# Patient Record
Sex: Female | Born: 1965
Health system: Southern US, Community
[De-identification: ages and names within clinical notes are randomized; demographics above are authoritative.]

## PROBLEM LIST (undated history)

## (undated) DIAGNOSIS — J449 Chronic obstructive pulmonary disease, unspecified: Secondary | ICD-10-CM

## (undated) DIAGNOSIS — I10 Essential (primary) hypertension: Secondary | ICD-10-CM

## (undated) DIAGNOSIS — E669 Obesity, unspecified: Secondary | ICD-10-CM

## (undated) DIAGNOSIS — M797 Fibromyalgia: Secondary | ICD-10-CM

## (undated) DIAGNOSIS — J45909 Unspecified asthma, uncomplicated: Secondary | ICD-10-CM

## (undated) HISTORY — PX: ABDOMINAL HYSTERECTOMY: SHX81

---

## 1998-02-07 ENCOUNTER — Emergency Department (HOSPITAL_COMMUNITY): Admission: EM | Admit: 1998-02-07 | Discharge: 1998-02-07 | Payer: Self-pay | Admitting: *Deleted

## 1998-02-12 ENCOUNTER — Other Ambulatory Visit: Admission: RE | Admit: 1998-02-12 | Discharge: 1998-02-12 | Payer: Self-pay | Admitting: Obstetrics

## 1998-02-12 ENCOUNTER — Ambulatory Visit (HOSPITAL_COMMUNITY): Admission: RE | Admit: 1998-02-12 | Discharge: 1998-02-12 | Payer: Self-pay | Admitting: Obstetrics

## 1998-02-26 ENCOUNTER — Ambulatory Visit (HOSPITAL_COMMUNITY): Admission: RE | Admit: 1998-02-26 | Discharge: 1998-02-26 | Payer: Self-pay | Admitting: Obstetrics

## 1998-02-26 ENCOUNTER — Encounter: Payer: Self-pay | Admitting: Obstetrics

## 2009-09-09 ENCOUNTER — Emergency Department (HOSPITAL_BASED_OUTPATIENT_CLINIC_OR_DEPARTMENT_OTHER): Admission: EM | Admit: 2009-09-09 | Discharge: 2009-09-09 | Payer: Self-pay | Admitting: Emergency Medicine

## 2009-10-31 ENCOUNTER — Emergency Department (HOSPITAL_BASED_OUTPATIENT_CLINIC_OR_DEPARTMENT_OTHER): Admission: EM | Admit: 2009-10-31 | Discharge: 2009-10-31 | Payer: Self-pay | Admitting: Emergency Medicine

## 2009-11-04 ENCOUNTER — Emergency Department (HOSPITAL_COMMUNITY): Admission: EM | Admit: 2009-11-04 | Discharge: 2009-11-04 | Payer: Self-pay | Admitting: Emergency Medicine

## 2009-11-25 ENCOUNTER — Emergency Department (HOSPITAL_COMMUNITY): Admission: EM | Admit: 2009-11-25 | Discharge: 2009-11-25 | Payer: Self-pay | Admitting: Family Medicine

## 2010-04-20 ENCOUNTER — Encounter: Payer: Self-pay | Admitting: Obstetrics and Gynecology

## 2010-06-13 LAB — POCT URINALYSIS DIPSTICK
Bilirubin Urine: NEGATIVE
Glucose, UA: NEGATIVE mg/dL
Ketones, ur: NEGATIVE mg/dL
Nitrite: NEGATIVE
Protein, ur: NEGATIVE mg/dL
Specific Gravity, Urine: 1.02 (ref 1.005–1.030)
Urobilinogen, UA: 1 mg/dL (ref 0.0–1.0)
pH: 6.5 (ref 5.0–8.0)

## 2010-06-16 LAB — URINE MICROSCOPIC-ADD ON

## 2010-06-16 LAB — URINALYSIS, ROUTINE W REFLEX MICROSCOPIC
Bilirubin Urine: NEGATIVE
Glucose, UA: NEGATIVE mg/dL
Hgb urine dipstick: NEGATIVE
Ketones, ur: NEGATIVE mg/dL
Nitrite: NEGATIVE
Protein, ur: NEGATIVE mg/dL
Specific Gravity, Urine: 1.025 (ref 1.005–1.030)
Urobilinogen, UA: 1 mg/dL (ref 0.0–1.0)
pH: 6 (ref 5.0–8.0)

## 2010-06-16 LAB — GC/CHLAMYDIA PROBE AMP, GENITAL: GC Probe Amp, Genital: NEGATIVE

## 2010-06-16 LAB — WET PREP, GENITAL: Yeast Wet Prep HPF POC: NONE SEEN

## 2012-06-04 ENCOUNTER — Emergency Department (HOSPITAL_BASED_OUTPATIENT_CLINIC_OR_DEPARTMENT_OTHER): Payer: 59

## 2012-06-04 ENCOUNTER — Emergency Department (HOSPITAL_BASED_OUTPATIENT_CLINIC_OR_DEPARTMENT_OTHER)
Admission: EM | Admit: 2012-06-04 | Discharge: 2012-06-04 | Disposition: A | Discharge status: Home / Self Care | Payer: 59 | Attending: Emergency Medicine | Admitting: Emergency Medicine

## 2012-06-04 ENCOUNTER — Encounter (HOSPITAL_BASED_OUTPATIENT_CLINIC_OR_DEPARTMENT_OTHER): Payer: Self-pay

## 2012-06-04 DIAGNOSIS — Z79899 Other long term (current) drug therapy: Secondary | ICD-10-CM | POA: Insufficient documentation

## 2012-06-04 DIAGNOSIS — I10 Essential (primary) hypertension: Secondary | ICD-10-CM | POA: Insufficient documentation

## 2012-06-04 DIAGNOSIS — J45909 Unspecified asthma, uncomplicated: Secondary | ICD-10-CM | POA: Insufficient documentation

## 2012-06-04 DIAGNOSIS — Y9389 Activity, other specified: Secondary | ICD-10-CM | POA: Insufficient documentation

## 2012-06-04 DIAGNOSIS — S139XXA Sprain of joints and ligaments of unspecified parts of neck, initial encounter: Secondary | ICD-10-CM | POA: Insufficient documentation

## 2012-06-04 DIAGNOSIS — Z87891 Personal history of nicotine dependence: Secondary | ICD-10-CM | POA: Insufficient documentation

## 2012-06-04 DIAGNOSIS — E669 Obesity, unspecified: Secondary | ICD-10-CM | POA: Insufficient documentation

## 2012-06-04 DIAGNOSIS — Y92410 Unspecified street and highway as the place of occurrence of the external cause: Secondary | ICD-10-CM | POA: Insufficient documentation

## 2012-06-04 HISTORY — DX: Unspecified asthma, uncomplicated: J45.909

## 2012-06-04 HISTORY — DX: Essential (primary) hypertension: I10

## 2012-06-04 HISTORY — DX: Obesity, unspecified: E66.9

## 2012-06-04 MED ORDER — DIAZEPAM 5 MG PO TABS
ORAL_TABLET | ORAL | Status: AC
  Administered 2012-06-04: 5 mg via ORAL
  Filled 2012-06-04: qty 1

## 2012-06-04 MED ORDER — HYDROCODONE-ACETAMINOPHEN 5-325 MG PO TABS
1.0000 | ORAL_TABLET | Freq: Once | ORAL | Status: AC
  Administered 2012-06-04: 1 via ORAL

## 2012-06-04 MED ORDER — HYDROCODONE-ACETAMINOPHEN 5-325 MG PO TABS
ORAL_TABLET | ORAL | Status: AC
  Administered 2012-06-04: 1 via ORAL
  Filled 2012-06-04: qty 1

## 2012-06-04 MED ORDER — DIAZEPAM 5 MG PO TABS
5.0000 mg | ORAL_TABLET | ORAL | Status: AC | PRN
Start: 2012-06-04 — End: ?

## 2012-06-04 MED ORDER — DIAZEPAM 5 MG PO TABS
5.0000 mg | ORAL_TABLET | Freq: Once | ORAL | Status: AC
  Administered 2012-06-04: 5 mg via ORAL

## 2012-06-04 MED ORDER — OXYCODONE-ACETAMINOPHEN 5-325 MG PO TABS: 2.0000 | ORAL_TABLET | ORAL | Status: AC | PRN
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
## 2014-01-04 ENCOUNTER — Emergency Department (HOSPITAL_BASED_OUTPATIENT_CLINIC_OR_DEPARTMENT_OTHER)
Admission: EM | Admit: 2014-01-04 | Discharge: 2014-01-04 | Disposition: A | Discharge status: Home / Self Care | Payer: Medicare Other | Attending: Emergency Medicine | Admitting: Emergency Medicine

## 2014-01-04 ENCOUNTER — Emergency Department (HOSPITAL_BASED_OUTPATIENT_CLINIC_OR_DEPARTMENT_OTHER): Payer: Medicare Other

## 2014-01-04 ENCOUNTER — Encounter (HOSPITAL_BASED_OUTPATIENT_CLINIC_OR_DEPARTMENT_OTHER): Payer: Self-pay | Admitting: Emergency Medicine

## 2014-01-04 DIAGNOSIS — Z79899 Other long term (current) drug therapy: Secondary | ICD-10-CM | POA: Insufficient documentation

## 2014-01-04 DIAGNOSIS — Z7951 Long term (current) use of inhaled steroids: Secondary | ICD-10-CM | POA: Diagnosis not present

## 2014-01-04 DIAGNOSIS — S59911A Unspecified injury of right forearm, initial encounter: Secondary | ICD-10-CM | POA: Diagnosis not present

## 2014-01-04 DIAGNOSIS — J45909 Unspecified asthma, uncomplicated: Secondary | ICD-10-CM | POA: Insufficient documentation

## 2014-01-04 DIAGNOSIS — S6991XA Unspecified injury of right wrist, hand and finger(s), initial encounter: Secondary | ICD-10-CM | POA: Insufficient documentation

## 2014-01-04 DIAGNOSIS — I10 Essential (primary) hypertension: Secondary | ICD-10-CM | POA: Insufficient documentation

## 2014-01-04 DIAGNOSIS — Y9389 Activity, other specified: Secondary | ICD-10-CM | POA: Diagnosis not present

## 2014-01-04 DIAGNOSIS — Z87891 Personal history of nicotine dependence: Secondary | ICD-10-CM | POA: Insufficient documentation

## 2014-01-04 DIAGNOSIS — Y92410 Unspecified street and highway as the place of occurrence of the external cause: Secondary | ICD-10-CM | POA: Insufficient documentation

## 2014-01-04 DIAGNOSIS — E669 Obesity, unspecified: Secondary | ICD-10-CM | POA: Insufficient documentation

## 2014-01-04 DIAGNOSIS — M25531 Pain in right wrist: Secondary | ICD-10-CM

## 2014-01-04 DIAGNOSIS — S46819A Strain of other muscles, fascia and tendons at shoulder and upper arm level, unspecified arm, initial encounter: Secondary | ICD-10-CM | POA: Diagnosis not present

## 2014-01-04 DIAGNOSIS — S4990XA Unspecified injury of shoulder and upper arm, unspecified arm, initial encounter: Secondary | ICD-10-CM | POA: Diagnosis present

## 2014-01-04 HISTORY — DX: Fibromyalgia: M79.7

## 2014-01-04 MED ORDER — ALBUTEROL SULFATE HFA 108 (90 BASE) MCG/ACT IN AERS
2.0000 | INHALATION_SPRAY | Freq: Once | RESPIRATORY_TRACT | Status: AC
Start: 2014-01-04 — End: 2014-01-04
  Administered 2014-01-04: 2 via RESPIRATORY_TRACT
  Filled 2014-01-04: qty 6.7

## 2014-01-04 MED ORDER — IBUPROFEN 800 MG PO TABS
800.0000 mg | ORAL_TABLET | Freq: Once | ORAL | Status: AC
  Administered 2014-01-04: 800 mg via ORAL
  Filled 2014-01-04: qty 1

## 2014-01-04 MED ORDER — NAPROXEN 500 MG PO TABS: 500.0000 mg | ORAL_TABLET | Freq: Two times a day (BID) | ORAL | Status: AC | PRN
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
## 2014-01-04 NOTE — ED Notes (Signed)
Pt assessed by EDP. Xray ordered of wrist. Discharged delayed due to pending results.

## 2014-01-04 NOTE — ED Notes (Signed)
MD at bedside. 

## 2014-01-04 NOTE — Discharge Instructions (Signed)
Use heat prior to gradually increasing stretching. Avoid heavy lifting until symptoms improve. Return for weakness bowel or bladder changes or new or worsening symptoms. Followup with her primary Dr. for possible referral to physical therapy and massage. Have bp rechecked in 1 wk.  If you were given medicines take as directed.  If you are on coumadin or contraceptives realize their levels and effectiveness is altered by many different medicines.  If you have any reaction (rash, tongues swelling, other) to the medicines stop taking and see a physician.   Please follow up as directed and return to the ER or see a physician for new or worsening symptoms.  Thank you. Filed Vitals:   01/04/14 0922  BP: 162/89  Pulse: 88  Temp: 98.2 F (36.8 C)  TempSrc: Oral  Resp: 16  Height: 5' 6.5" (1.689 m)  Weight: 233 lb (105.688 kg)  SpO2: 98%
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
## 2014-08-02 ENCOUNTER — Emergency Department (HOSPITAL_BASED_OUTPATIENT_CLINIC_OR_DEPARTMENT_OTHER)
Admission: EM | Admit: 2014-08-02 | Discharge: 2014-08-02 | Disposition: A | Discharge status: Home / Self Care | Payer: Medicare Other | Attending: Emergency Medicine | Admitting: Emergency Medicine

## 2014-08-02 ENCOUNTER — Encounter (HOSPITAL_BASED_OUTPATIENT_CLINIC_OR_DEPARTMENT_OTHER): Payer: Self-pay | Admitting: *Deleted

## 2014-08-02 DIAGNOSIS — Z7951 Long term (current) use of inhaled steroids: Secondary | ICD-10-CM | POA: Insufficient documentation

## 2014-08-02 DIAGNOSIS — I10 Essential (primary) hypertension: Secondary | ICD-10-CM | POA: Diagnosis not present

## 2014-08-02 DIAGNOSIS — E669 Obesity, unspecified: Secondary | ICD-10-CM | POA: Insufficient documentation

## 2014-08-02 DIAGNOSIS — Z87891 Personal history of nicotine dependence: Secondary | ICD-10-CM | POA: Insufficient documentation

## 2014-08-02 DIAGNOSIS — J45909 Unspecified asthma, uncomplicated: Secondary | ICD-10-CM | POA: Diagnosis not present

## 2014-08-02 DIAGNOSIS — K029 Dental caries, unspecified: Secondary | ICD-10-CM | POA: Diagnosis not present

## 2014-08-02 DIAGNOSIS — M797 Fibromyalgia: Secondary | ICD-10-CM | POA: Diagnosis not present

## 2014-08-02 DIAGNOSIS — Z79899 Other long term (current) drug therapy: Secondary | ICD-10-CM | POA: Insufficient documentation

## 2014-08-02 DIAGNOSIS — K089 Disorder of teeth and supporting structures, unspecified: Secondary | ICD-10-CM | POA: Diagnosis not present

## 2014-08-02 DIAGNOSIS — F329 Major depressive disorder, single episode, unspecified: Secondary | ICD-10-CM | POA: Diagnosis not present

## 2014-08-02 DIAGNOSIS — K0889 Other specified disorders of teeth and supporting structures: Secondary | ICD-10-CM

## 2014-08-02 MED ORDER — AMOXICILLIN 500 MG PO CAPS: 500.0000 mg | ORAL_CAPSULE | Freq: Three times a day (TID) | ORAL | Status: DC

## 2014-08-02 MED ORDER — OXYCODONE-ACETAMINOPHEN 5-325 MG PO TABS
2.0000 | ORAL_TABLET | Freq: Once | ORAL | Status: AC
  Administered 2014-08-02: 2 via ORAL
  Filled 2014-08-02: qty 2

## 2014-08-02 MED ORDER — OXYCODONE-ACETAMINOPHEN 5-325 MG PO TABS: 1.0000 | ORAL_TABLET | Freq: Four times a day (QID) | ORAL | Status: AC | PRN

## 2014-08-02 NOTE — ED Notes (Signed)
Dental pain off and on for 3 days. Fibromyalgia. States when she turns her neck it pops.

## 2014-08-02 NOTE — ED Notes (Signed)
C/o left lower tooth pain x 3 days  Also having pain tro left neck, shoulder and back,  Neck poping at times,  Unable to get pain meds from her regular md
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
## 2014-08-02 NOTE — Discharge Instructions (Signed)
Dental Pain A tooth ache may be caused by cavities (tooth decay). Cavities expose the nerve of the tooth to air and hot or cold temperatures. It may come from an infection or abscess (also called a boil or furuncle) around your tooth. It is also often caused by dental caries (tooth decay). This causes the pain you are having. DIAGNOSIS  Your caregiver can diagnose this problem by exam. TREATMENT   If caused by an infection, it may be treated with medications which kill germs (antibiotics) and pain medications as prescribed by your caregiver. Take medications as directed.  Only take over-the-counter or prescription medicines for pain, discomfort, or fever as directed by your caregiver.  Whether the tooth ache today is caused by infection or dental disease, you should see your dentist as soon as possible for further care. SEEK MEDICAL CARE IF: The exam and treatment you received today has been provided on an emergency basis only. This is not a substitute for complete medical or dental care. If your problem worsens or new problems (symptoms) appear, and you are unable to meet with your dentist, call or return to this location. SEEK IMMEDIATE MEDICAL CARE IF:   You have a fever.  You develop redness and swelling of your face, jaw, or neck.  You are unable to open your mouth.  You have severe pain uncontrolled by pain medicine. MAKE SURE YOU:   Understand these instructions.  Will watch your condition.  Will get help right away if you are not doing well or get worse. Document Released: 03/16/2005 Document Revised: 06/08/2011 Document Reviewed: 11/02/2007 [facility] Patient Information 2015 [place], [place]. This information is not intended to replace advice given to you by your health care provider. Make sure you discuss any questions you have with your health care provider. Fibromyalgia Fibromyalgia is a disorder that is often misunderstood. It is associated with muscular pains and  tenderness that comes and goes. It is often associated with fatigue and sleep disturbances. Though it tends to be long-lasting, fibromyalgia is not life-threatening. CAUSES  The exact cause of fibromyalgia is unknown. People with certain gene types are predisposed to developing fibromyalgia and other conditions. Certain factors can play a role as triggers, such as:  Spine disorders.  Arthritis.  Severe injury (trauma) and other physical stressors.  Emotional stressors. SYMPTOMS   The main symptom is pain and stiffness in the muscles and joints, which can vary over time.  Sleep and fatigue problems. Other related symptoms may include:  Bowel and bladder problems.  Headaches.  Visual problems.  Problems with odors and noises.  Depression or mood changes.  Painful periods (dysmenorrhea).  Dryness of the skin or eyes. DIAGNOSIS  There are no specific tests for diagnosing fibromyalgia. Patients can be diagnosed accurately from the specific symptoms they have. The diagnosis is made by determining that nothing else is causing the problems. TREATMENT  There is no cure. Management includes medicines and an active, healthy lifestyle. The goal is to enhance physical fitness, decrease pain, and improve sleep. HOME CARE INSTRUCTIONS   Only take over-the-counter or prescription medicines as directed by your caregiver. Sleeping pills, tranquilizers, and pain medicines may make your problems worse.  Low-impact aerobic exercise is very important and advised for treatment. At first, it may seem to make pain worse. Gradually increasing your tolerance will overcome this feeling.  Learning relaxation techniques and how to control stress will help you. Biofeedback, visual imagery, hypnosis, muscle relaxation, yoga, and meditation are all options.  Anti-inflammatory  medicines and physical therapy may provide short-term help.  Acupuncture or massage treatments may help.  Take muscle relaxant  medicines as suggested by your caregiver.  Avoid stressful situations.  Plan a healthy lifestyle. This includes your diet, sleep, rest, exercise, and friends.  Find and practice a hobby you enjoy.  Join a fibromyalgia support group for interaction, ideas, and sharing advice. This may be helpful. SEEK MEDICAL CARE IF:  You are not having good results or improvement from your treatment. FOR MORE INFORMATION  National Fibromyalgia Association: www.fmaware.org Arthritis Foundation: www.arthritis.org Document Released: 03/16/2005 Document Revised: 06/08/2011 Document Reviewed: 06/26/2009 [facility]Exit[facility] Patient Information 2015 [place], [place]. This information is not intended to replace advice given to you by your health care provider. Make sure you discuss any questions you have with your health care provider.

## 2014-09-08 ENCOUNTER — Encounter (HOSPITAL_BASED_OUTPATIENT_CLINIC_OR_DEPARTMENT_OTHER): Payer: Self-pay | Admitting: *Deleted

## 2014-09-08 ENCOUNTER — Emergency Department (HOSPITAL_BASED_OUTPATIENT_CLINIC_OR_DEPARTMENT_OTHER): Payer: Medicare Other

## 2014-09-08 ENCOUNTER — Emergency Department (HOSPITAL_BASED_OUTPATIENT_CLINIC_OR_DEPARTMENT_OTHER)
Admission: EM | Admit: 2014-09-08 | Discharge: 2014-09-08 | Disposition: A | Discharge status: Home / Self Care | Payer: Medicare Other | Attending: Emergency Medicine | Admitting: Emergency Medicine

## 2014-09-08 DIAGNOSIS — M5412 Radiculopathy, cervical region: Secondary | ICD-10-CM

## 2014-09-08 DIAGNOSIS — R059 Cough, unspecified: Secondary | ICD-10-CM

## 2014-09-08 DIAGNOSIS — Z720 Tobacco use: Secondary | ICD-10-CM | POA: Diagnosis not present

## 2014-09-08 DIAGNOSIS — K644 Residual hemorrhoidal skin tags: Secondary | ICD-10-CM | POA: Insufficient documentation

## 2014-09-08 DIAGNOSIS — Z90710 Acquired absence of both cervix and uterus: Secondary | ICD-10-CM | POA: Diagnosis not present

## 2014-09-08 DIAGNOSIS — Z79899 Other long term (current) drug therapy: Secondary | ICD-10-CM | POA: Diagnosis not present

## 2014-09-08 DIAGNOSIS — I10 Essential (primary) hypertension: Secondary | ICD-10-CM | POA: Diagnosis not present

## 2014-09-08 DIAGNOSIS — K649 Unspecified hemorrhoids: Secondary | ICD-10-CM

## 2014-09-08 DIAGNOSIS — R05 Cough: Secondary | ICD-10-CM | POA: Insufficient documentation

## 2014-09-08 DIAGNOSIS — J45909 Unspecified asthma, uncomplicated: Secondary | ICD-10-CM | POA: Diagnosis not present

## 2014-09-08 DIAGNOSIS — E669 Obesity, unspecified: Secondary | ICD-10-CM | POA: Diagnosis not present

## 2014-09-08 DIAGNOSIS — M797 Fibromyalgia: Secondary | ICD-10-CM | POA: Diagnosis not present

## 2014-09-08 DIAGNOSIS — Z7951 Long term (current) use of inhaled steroids: Secondary | ICD-10-CM | POA: Insufficient documentation

## 2014-09-08 DIAGNOSIS — M542 Cervicalgia: Secondary | ICD-10-CM | POA: Diagnosis present

## 2014-09-08 LAB — CBC WITH DIFFERENTIAL/PLATELET
BASOS ABS: 0 10*3/uL (ref 0.0–0.1)
BASOS PCT: 0 % (ref 0–1)
EOS ABS: 0 10*3/uL (ref 0.0–0.7)
EOS PCT: 0 % (ref 0–5)
HEMATOCRIT: 42.9 % (ref 36.0–46.0)
HEMOGLOBIN: 13.9 g/dL (ref 12.0–15.0)
LYMPHS ABS: 3.5 10*3/uL (ref 0.7–4.0)
Lymphocytes Relative: 38 % (ref 12–46)
MCH: 25.6 pg — AB (ref 26.0–34.0)
MCHC: 32.4 g/dL (ref 30.0–36.0)
MCV: 79 fL (ref 78.0–100.0)
Monocytes Absolute: 0.6 10*3/uL (ref 0.1–1.0)
Monocytes Relative: 6 % (ref 3–12)
NEUTROS PCT: 56 % (ref 43–77)
Neutro Abs: 5.2 10*3/uL (ref 1.7–7.7)
Platelets: 242 10*3/uL (ref 150–400)
RBC: 5.43 MIL/uL — AB (ref 3.87–5.11)
RDW: 14.9 % (ref 11.5–15.5)
WBC: 9.3 10*3/uL (ref 4.0–10.5)

## 2014-09-08 LAB — BASIC METABOLIC PANEL
Anion gap: 7 (ref 5–15)
BUN: 12 mg/dL (ref 6–20)
CALCIUM: 8.5 mg/dL — AB (ref 8.9–10.3)
CO2: 28 mmol/L (ref 22–32)
Chloride: 103 mmol/L (ref 101–111)
Creatinine, Ser: 0.68 mg/dL (ref 0.44–1.00)
GFR calc non Af Amer: >60 mL/min (ref >60)
GLUCOSE: 105 mg/dL — AB (ref 65–99)
POTASSIUM: 3.4 mmol/L — AB (ref 3.5–5.1)
SODIUM: 138 mmol/L (ref 135–145)

## 2014-09-08 MED ORDER — IBUPROFEN 800 MG PO TABS: 800.0000 mg | ORAL_TABLET | Freq: Three times a day (TID) | ORAL | Status: AC

## 2014-09-08 MED ORDER — HYDROCORTISONE ACETATE 25 MG RE SUPP: 25.0000 mg | Freq: Two times a day (BID) | RECTAL | Status: AC

## 2014-09-08 MED ORDER — CYCLOBENZAPRINE HCL 10 MG PO TABS: 10.0000 mg | ORAL_TABLET | Freq: Two times a day (BID) | ORAL | Status: AC | PRN

## 2014-09-08 MED ORDER — DOCUSATE SODIUM 100 MG PO CAPS
100.0000 mg | ORAL_CAPSULE | Freq: Once | ORAL | Status: AC
  Administered 2014-09-08: 100 mg via ORAL
  Filled 2014-09-08: qty 1

## 2014-09-08 MED ORDER — IBUPROFEN 800 MG PO TABS
800.0000 mg | ORAL_TABLET | Freq: Once | ORAL | Status: AC
  Administered 2014-09-08: 800 mg via ORAL
  Filled 2014-09-08: qty 1

## 2014-09-08 NOTE — ED Notes (Signed)
Presents to ED with c/o rt arm pain with some numbness in Rt hand/ fingers, onset approx 1 month ago, states has become worse

## 2014-09-08 NOTE — ED Notes (Signed)
Pt refused d/c vitals.  sts she dosent like when the bp cuff pumps up.

## 2014-09-08 NOTE — Discharge Instructions (Signed)
For pain control you may take up to  of ibuprofen (that is usually 4 over the counter pills)  3 times a day (take with food) and acetaminophen  (this is 3 over the counter pills) four times a day. Do not drink alcohol or combine with other medications that have acetaminophen as an ingredient (Read the labels!).   For breakthrough pain you may take Flexeril. Do not drink alcohol, drive or operate heavy machinery when taking Flexeril.  Please follow with your primary care doctor in the next 5 days for high blood pressure evaluation. If you do not have a primary care doctor, present to urgent care. Reduce salt intake. Seek emergency medical care for unilateral weakness, slurring, change in vision, or chest pain and shortness of breath.  Do not hesitate to return to the emergency room for any new, worsening or concerning symptoms.  Please obtain primary care using resource guide below. Let them know that you were seen in the emergency room and that they will need to obtain records for further outpatient management.   Cervical Radiculopathy Cervical radiculopathy means a nerve in the neck is pinched or bruised. This can cause pain or loss of feeling (numbness) that runs from your neck to your arm and fingers. HOME CARE   Put ice on the injured or painful area.  Put ice in a plastic bag.  Place a towel between your skin and the bag.  Leave the ice on for 15-20 minutes, 03-04 times a day, or as told by your doctor.  If ice does not help, you can try using heat. Take a warm shower or bath, or use a hot water bottle as told by your doctor.  You may try a gentle neck and shoulder massage.  Use a flat pillow when you sleep.  Only take medicines as told by your doctor.  Keep all physical therapy visits as told by your doctor.  If you are given a soft collar, wear it as told by your doctor. GET HELP RIGHT AWAY IF:   Your pain gets worse and is not controlled with medicine.  You  lose feeling or feel weak in your hand, arm, face, or leg.  You have a fever or stiff neck.  You cannot control when you poop or pee (incontinence).  You have trouble with walking, balance, or speaking. MAKE SURE YOU:   Understand these instructions.  Will watch your condition.  Will get help right away if you are not doing well or get worse. Document Released: 03/05/2011 Document Revised: 06/08/2011 Document Reviewed: 03/05/2011 [facility] Patient Information 2015 [place], [place]. This information is not intended to replace advice given to you by your health care provider. Make sure you discuss any questions you have with your health care provider.  Hemorrhoids Hemorrhoids are puffy (swollen) veins around the rectum or anus. Hemorrhoids can cause pain, itching, bleeding, or irritation. HOME CARE  Eat foods with fiber, such as whole grains, beans, nuts, fruits, and vegetables. Ask your doctor about taking products with added fiber in them (fibersupplements).  Drink enough fluid to keep your pee (urine) clear or pale yellow.  Exercise often.  Go to the bathroom when you have the urge to poop. Do not wait.  Avoid straining to poop (bowel movement).  Keep the butt area dry and clean. Use wet toilet paper or moist paper towels.  Medicated creams and medicine inserted into the anus (anal suppository) may be used or applied as told.  Only take medicine as  told by your doctor.  Take a warm water bath (sitz bath) for 15-20 minutes to ease pain. Do this 3-4 times a day.  Place ice packs on the area if it is tender or puffy. Use the ice packs between the warm water baths.  Put ice in a plastic bag.  Place a towel between your skin and the bag.  Leave the ice on for 15-20 minutes, 03-04 times a day.  Do not use a donut-shaped pillow or sit on the toilet for a long time. GET HELP RIGHT AWAY IF:   You have more pain that is not controlled by treatment or medicine.  You have  bleeding that will not stop.  You have trouble or are unable to poop (bowel movement).  You have pain or puffiness outside the area of the hemorrhoids. MAKE SURE YOU:   Understand these instructions.  Will watch your condition.  Will get help right away if you are not doing well or get worse. Document Released: 12/24/2007 Document Revised: 03/02/2012 Document Reviewed: 01/26/2012 [facility] Patient Information 2015 [place], [place]. This information is not intended to replace advice given to you by your health care provider. Make sure you discuss any questions you have with your health care provider.    Emergency Department Resource Guide 1) Find a Doctor and Pay Out of Pocket Although you won't have to find out who is covered by your insurance plan, it is a good idea to ask around and get recommendations. You will then need to call the office and see if the doctor you have chosen will accept you as a new patient and what types of options they offer for patients who are self-pay. Some doctors offer discounts or will set up payment plans for their patients who do not have insurance, but you will need to ask so you aren't surprised when you get to your appointment.  2) Contact Your Local Health Department Not all health departments have doctors that can see patients for sick visits, but many do, so it is worth a call to see if yours does. If you don't know where your local health department is, you can check in your phone book. The CDC also has a tool to help you locate your state's health department, and many state websites also have listings of all of their local health departments.  3) Find a Walk-in Clinic If your illness is not likely to be very severe or complicated, you may want to try a walk in clinic. These are popping up all over the country in pharmacies, drugstores, and shopping centers. They're usually staffed by nurse practitioners or physician assistants that have been trained to  treat common illnesses and complaints. They're usually fairly quick and inexpensive. However, if you have serious medical issues or chronic medical problems, these are probably not your best option.  No Primary Care Doctor: - Call Health Connect at  [phone] - they can help you locate a primary care doctor that  accepts your insurance, provides certain services, etc. - Physician Referral Service- [phone]  Chronic Pain Problems: Organization         Address  Phone   Notes  [facility]  [phone] Patients need to be referred by their primary care doctor.   Medication Assistance: Organization         Address  Phone   Notes  [facility] Medication [facility] [address] [place] [place], [place] [address] [phone] --Must be  a resident of [facility] -- Must have NO insurance coverage whatsoever (no Medicaid/ Medicare, etc.) -- The pt. MUST have a primary care doctor that directs their care regularly and follows them in the community   [facility]  [phone]   [employer]  [phone]    Agencies that provide inexpensive medical care: Organization         Address  Phone   Notes  [facility]  [phone]   [facility]    [phone]   [facility] [address] [place], [place] [address] [phone]   [facility] [address], [place] [phone]   Planned Parenthood    [phone]   [facility]    [phone]   [facility]  [address], [place] Phone:  [phone], Fax:  [phone] Hours of Operation:  9 am - 6 pm, M-F.  Also accepts Medicaid/Medicare and self-pay.  [facility]  [address], [place] Phone: [phone], Fax: [phone]. Hours of Operation:  8:30 am - 5:30 pm, M-F.  Also accepts Medicaid and self-pay.  [facility]  [place] [address] [place] Phone: [phone]   [facility] [address] Natasha Bence [place], [place] [phone], Ext. [phone] Mondays & Thursdays: 7-9 AM.  First 15 patients are seen on a first come, first serve basis.    Medicaid-accepting [facility] Providers:  Organization         Address  Phone   Notes  [facility] [address], [place] [phone] Also accepts self-pay patients.  [facility] [address] Laurell Josephs [place], [place]  [phone]   [facility] [address], [place] [phone]   [facility] [address] [place] [phone]   Renaye Rakers [address], [place]   [phone] Only accepts [place] patients after they have their name applied to their card.   Self-Pay (no insurance) in [facility]:  Organization         Address  Phone   Notes  Sickle Cell Patients, [facility] Internal Medicine [address] [place], [place] [phone]   [facility] [address] [place], [place] [phone]   [facility] [place]  [address],  [phone]   [facility]/Dr. Osei-Bonsu  [address] [place] or [address], [place] [phone] Phone number for both [place] and [place] locations is the same.  [facility] [address] [place] [phone]   [facility] [address], [place] or [address] [phone]   [facility] [address], [place] [phone], phone; [phone], fax Sees patients 1st and 3rd Saturday of every month.  Must not qualify for public or private insurance (i.e. Medicaid, Medicare, [place] Health Choice, Veterans' Benefits)  Household income should be no more than 200%  of the poverty level The clinic cannot treat you if you are pregnant or think you are pregnant  Sexually transmitted diseases are not treated at the clinic.    Dental Care: Organization  Address  Phone  Notes  [facility] [address] [place], [place] [phone] Accepts children up to age 56 who are enrolled in [place] or [place] Health Choice; pregnant women with a Medicaid card; and children who have applied for Medicaid or [place] Health Choice, but were declined, whose parents can pay a reduced fee at time of service.  [facility]  [address], [place] [phone] Accepts children up to age 43 who are enrolled in [place] or [place] Health Choice; pregnant women with a Medicaid card; and children who have applied for Medicaid or [place] Health Choice, but were declined, whose parents can pay a reduced fee at time of service.  [facility]  [address] [place], [place] [phone] Patients are seen by appointment only. Walk-ins are not accepted. [facility] will see patients 19 years of age and older. Monday - Tuesday (8am-5pm) Most Wednesdays (8:30-5pm) $30 per visit, cash only  [facility] Adult Dental Access [facility]  [address], [facility] [phone] Patients are seen by appointment only. Walk-ins are not accepted. [facility] will see patients 63 years of age and older. One Wednesday Evening (Monthly: Volunteer Based).  $30 per visit, cash only  [employer]  [phone] for adults; Children under age 67, call Graduate Pediatric Dentistry at [phone]. Children aged 66-14, please call [phone] to request a pediatric application.  Dental services are provided in all areas of dental care including fillings, crowns and bridges, complete and partial dentures, implants, gum treatment, root canals, and  extractions. Preventive care is also provided. Treatment is provided to both adults and children. Patients are selected via a lottery and there is often a waiting list.   [facility] [address], [place]  [phone] [email or web address]   [facility] [address] [place], [place] [phone], Ext. [phone] Second and Fourth Thursday of each month, opens at 6:30 AM; Clinic ends at 9 AM.  Patients are seen on a first-come first-served basis, and a limited number are seen during each clinic.   [facility]  [address] Ether Griffins [place], [place] [phone]   Eligibility Requirements You must have lived in [place], [place], or [place] counties for at least the last three months.   You cannot be eligible for state or federal sponsored [place], including [employer], [place], or [employer].   You generally cannot be eligible for healthcare insurance through your employer.    How to apply: Eligibility screenings are held every Tuesday and Wednesday afternoon from 1:00 pm until 4:00 pm. You do not need an appointment for the interview!  [facility] [address] [place], [place] [phone]   [facility] Health Department  [phone]   [facility] Health Department  [phone]   [facility] Health Department  [phone]    Behavioral Health Resources in the Community: Intensive Outpatient Programs Organization         Address  Phone  Notes  [facility] [address] [place], [place] [phone]   [facility] Outpatient [address] [place], [place] [phone]   [facility] [address], [place], [place]  [phone]   [facility] Mental Health [address]  [place], [place] [phone] or [phone]   Substance Abuse Resources Organization  Address  Phone  Notes  [facility]  [phone]     [facility]  [phone]   [facility] [place]  [phone]   Floydene Flock  [phone]   Residential & Outpatient Substance Abuse Program  [phone]   Psychological Services Organization         Address  Phone  Notes  [facility]  [phone]   [facility] Services  [phone]   [facility] Mental Health [address] [place] [phone] or [phone]    Mobile Crisis Teams Organization         Address  Phone  Notes  [facility], Mobile Crisis Care Unit  [phone]   [facility]  [address] [place], [place] [phone]   Doristine Locks [address] [place] [phone]    Self-Help/Support Groups Organization         Address  Phone             Notes  [facility] [place] - variety of support groups  336- I7437963 Call for more information  Narcotics Anonymous (NA), [facility] [address], [employer] [place]  2 meetings at this location   Statistician         Address  Phone  Notes  [facility] [address],    [place]  [phone]   [facility]  [address] [place] [address], [place], [place] [phone]   [facility] [address] [place], [place] [phone] Admissions: 8am-3pm M-F  [facility] [address]    [place], [place] [phone]   [facility] [address] [place], [place], [place] [phone]   [facility] [address],  [place], [place] [phone]   [facility] - Intensive Outpatient [address] Laurell Josephs [address], [place], [place] [phone]   [facility] ([facility] [address] [place] [address] [place]  [place], [place] [phone] or [phone]   [facility] ([facility]) [address], [place], [place] [phone] Accepts Medicaid  [facility] [place] [address]   [place] [phone] Substance Abuse/Addiction Treatment   [facility] Organization         Address  Phone  Notes  [facility]  [phone]   Angie Fava, PhD [address] Ervin Knack [place], [place]   [phone] or [phone]   [facility]   [address] [place], [place] [phone]   [facility] [address] [place], [place] [phone] Insurance/Medicaid/sponsorship through [facility] and Families [address]                                    [place], [place] [phone] Therapy/tele-psych/case  [facility] [address], [place] [phone]    Dr. Lolly Mustache  [phone]   [facility]  [employer] [facility] Dept. 1) [address], [place] 2) [address] [place] 3)  [address], [place] [phone]  [phone]   [facility] Child Abuse Hotline [phone] or [phone] (After Hours)

## 2014-09-08 NOTE — ED Notes (Signed)
MD at bedside. 

## 2014-09-08 NOTE — ED Notes (Signed)
Pt medicated per PA-C orders, explained meds to pt

## 2014-09-08 NOTE — ED Notes (Signed)
Patient transported to X-ray 

## 2014-09-08 NOTE — ED Provider Notes (Signed)
CSN: [id number]     Arrival date & time 09/08/14  1109 History   First MD Initiated Contact with Patient 09/08/14 1159     Chief Complaint  Patient presents with  . Arm Pain     (Consider location/radiation/quality/duration/timing/severity/associated sxs/prior Treatment) HPI   Jakyra Kenealy is a 49 y.o. female complaining of severe, 10 out of 10 right arm and right neck pain onset 1 month ago, exacerbated by cough described as shooting and burning. Patient is taking Percocet, gabapentin and over-the-counter medications at home with little relief. Patient is right-hand-dominant. She denies weakness, dropping objects, trauma. Patient has multiple other complaints including bleeding hemorrhoid she had an episode 4 days ago and one this morning. She states that her fibromyalgia is also acting up, she does not have a primary care doctor she states that she is to follow-up Toma Copier but her and her primary care doctor "had words." Patient is also concerned that she has pneumonia, she's had this several times in the past, she is a smoker and she has asthma. She reports productive cough she denies chest pain, shortness of breath above her baseline, fever, chills, nausea, vomiting, abdominal pain, change in bowel or bladder habits. She states that her stool is soft. She's not taking any over-the-counter medications for her hemorrhoids.  Past Medical History  Diagnosis Date  . Asthma   . Hypertension   . Obesity   . Fibromyalgia    Past Surgical History  Procedure Laterality Date  . Abdominal hysterectomy     History reviewed. No pertinent family history. History  Substance Use Topics  . Smoking status: Current Some Day Smoker    Types: Cigarettes  . Smokeless tobacco: Never Used  . Alcohol Use: No   OB History    No data available     Review of Systems  10 systems reviewed and found to be negative, except as noted in the HPI.   Allergies  Shellfish allergy and Eggs or egg-derived  products  Home Medications   Prior to Admission medications   Medication Sig Start Date End Date Taking? Authorizing Provider  albuterol (PROVENTIL HFA;VENTOLIN HFA) 108 (90 BASE) MCG/ACT inhaler Inhale 2 puffs into the lungs every 6 (six) hours as needed for wheezing.    Historical Provider, MD  albuterol (PROVENTIL) (5 MG/ML) 0.5% nebulizer solution Take 2.5 mg by nebulization every 6 (six) hours as needed for wheezing.    Historical Provider, MD  budesonide-formoterol (SYMBICORT) 160-4.5 MCG/ACT inhaler Inhale 2 puffs into the lungs 2 (two) times daily.    Historical Provider, MD  cyclobenzaprine (FLEXERIL) 10 MG tablet Take 1 tablet (10 mg total) by mouth 2 (two) times daily as needed for muscle spasms. 09/08/14   Pearlean Sabina, PA-C  diazepam (VALIUM) 5 MG tablet Take 1 tablet (5 mg total) by mouth every 4 (four) hours as needed (muscle strain). 06/04/12   Lorre Nick, MD  doxepin (SINEQUAN) 10 MG capsule Take 10 mg by mouth.    Historical Provider, MD  hydrochlorothiazide (HYDRODIURIL) 25 MG tablet Take 25 mg by mouth daily.    Historical Provider, MD  hydrocortisone (ANUSOL-HC) 25 MG suppository Place 1 suppository (25 mg total) rectally 2 (two) times daily. For 7 days 09/08/14   Joni Reining Azlaan Isidore, PA-C  ibuprofen (ADVIL,MOTRIN) 800 MG tablet Take 1 tablet (800 mg total) by mouth 3 (three) times daily. 09/08/14   Nealy Hickmon, PA-C  lisinopril (PRINIVIL,ZESTRIL) 5 MG tablet Take 5 mg by mouth daily.    Historical Provider, MD  naproxen (NAPROSYN) 500 MG tablet Take 1 tablet (500 mg total) by mouth 2 (two) times daily as needed. 01/04/14   Blane Ohara, MD  oxyCODONE-acetaminophen (PERCOCET/ROXICET) 5-325 MG per tablet Take 2 tablets by mouth every 4 (four) hours as needed for pain. 06/04/12   Lorre Nick, MD  oxyCODONE-acetaminophen (PERCOCET/ROXICET) 5-325 MG per tablet Take 1 tablet by mouth every 6 (six) hours as needed for severe pain. 08/02/14   Tiffany Neva Seat, PA-C   BP 165/100 mmHg   Pulse 76  Temp(Src) 98.4 F (36.9 C)  Resp 16  Ht 5' 6.5" (1.689 m)  Wt 236 lb (107.049 kg)  BMI 37.53 kg/m2  SpO2 100% Physical Exam  Constitutional: She is oriented to person, place, and time. She appears well-developed and well-nourished. No distress.  HENT:  Head: Normocephalic.  Mouth/Throat: Oropharynx is clear and moist.  Eyes: Conjunctivae and EOM are normal. Pupils are equal, round, and reactive to light.  Neck: Normal range of motion. Neck supple.  Spurling test is positive bilaterally. Patient has 5 out of 5 strength in grip, biceps and triceps. Patient has normal sensation in the bilateral upper extremities and can differentiate pinprick from light touch.she is diffusely, exquisitely tender to palpation along the midline and paracervical musculature.  Cardiovascular: Normal rate, regular rhythm and intact distal pulses.   Pulmonary/Chest: Effort normal and breath sounds normal. No stridor. No respiratory distress. She has no wheezes. She has no rales. She exhibits no tenderness.  Abdominal: Soft. Bowel sounds are normal. She exhibits no distension and no mass. There is no tenderness. There is no rebound and no guarding.  Genitourinary:  Patient declines digital rectal exam visual external exam is chaperoned by RN Kathlene November: external hemorrhoids with no rashes or lesions, no gross blood visualized.  Musculoskeletal: Normal range of motion. She exhibits no edema or tenderness.  Neurological: She is alert and oriented to person, place, and time.  II-Visual fields grossly intact. III/IV/VI-Extraocular movements intact.  Pupils reactive bilaterally. V/VII-Smile symmetric, equal eyebrow raise,  facial sensation intact VIII- Hearing grossly intact IX/X-Normal gag XI-bilateral shoulder shrug XII-midline tongue extension Motor: 5/5 bilaterally with normal tone and bulk Cerebellar: Normal finger-to-nose  and normal heel-to-shin test.   Romberg negative Ambulates with a coordinated  gait   Psychiatric: She has a normal mood and affect.  Nursing note and vitals reviewed.   ED Course  Procedures (including critical care time) Labs Review Labs Reviewed  CBC WITH DIFFERENTIAL/PLATELET - Abnormal; Notable for the following:    RBC 5.43 (*)    MCH 25.6 (*)    All other components within normal limits  BASIC METABOLIC PANEL - Abnormal; Notable for the following:    Potassium 3.4 (*)    Glucose, Bld 105 (*)    Calcium 8.5 (*)    All other components within normal limits    Imaging Review Dg Chest 2 View  09/08/2014   CLINICAL DATA:  Right arm pain, heaviness, no known injury.  EXAM: CHEST  2 VIEW  COMPARISON:  03/29/2014  FINDINGS: There is bilateral mild interstitial thickening. There is no focal parenchymal opacity. There is no pleural effusion or pneumothorax. There is mild stable cardiomegaly.  The osseous structures are unremarkable.  IMPRESSION: Stable cardiomegaly with mild pulmonary vascular congestion.   Electronically Signed   By: Elige Ko   On: 09/08/2014 13:24   Dg Cervical Spine Complete  09/08/2014   CLINICAL DATA:  Right arm pain and heaviness for 1 month  EXAM: CERVICAL SPINE  4+ VIEWS  COMPARISON:  12/30/2013  FINDINGS: The cervical spine is visualized to the level of C7.  The vertebral body heights are maintained. The alignment is normal. Loss of the normal cervical lordosis with straightening. The prevertebral soft tissues are normal. There is no acute fracture or static listhesis. There is degenerative disc disease at C4-5, C5-6 and C6-7 with disc space narrowing.  IMPRESSION: Mild degenerative disc disease at C4-5, C5-6 and C6-7. No significant foraminal stenosis.   Electronically Signed   By: Elige Ko   On: 09/08/2014 13:25     EKG Interpretation None      MDM   Final diagnoses:  Cough  Cervical radiculopathy  Hemorrhoids, unspecified hemorrhoid type    Filed Vitals:   09/08/14 1118 09/08/14 1246  BP: 174/100 165/100  Pulse: 100  76  Temp: 98.4 F (36.9 C)   Resp: 16 16  Height: 5' 6.5" (1.689 m)   Weight: 236 lb (107.049 kg)   SpO2: 99% 100%    Medications  docusate sodium (COLACE) capsule 100 mg (100 mg Oral Given 09/08/14 1245)  ibuprofen (ADVIL,MOTRIN) tablet 800 mg (800 mg Oral Given 09/08/14 1244)    Eiko Cretella is a pleasant 49 y.o. female presenting with dry cough and shooting sensation down right arm. Neuro exam is nonfocal, Spurling test positive. I think this is likely a cervical radiculopathy, no decrease in strength or sensation. Doubt cord compression. Patient is also reporting issues with her hemorrhoids. She will not allow me to do digital rectal exam. Blood work with no anemia, no electrolyte abnormality, patient is reporting a cough there is cardiomegaly with no infiltrate, mild vascular congestion. X-ray of neck shows osteoarthritis no significant for medial stenosis. Patient is advised to take stool softeners, written a prescription for hydrocortisone suppository. I will write her for muscle relaxers and recommend high-dose NSAIDs. Patient is displeased and requesting narcotics, and explained to her that that is not indicated. She will be given a referral to surgery for her hemorrhoids, sports medicine for her cervical radiculopathy. I will give her a resource guide so that she can establish primary care. I've explained to her that if she would like to change primary care physician she will need to call the Medicaid office to request that.  Evaluation does not show pathology that would require ongoing emergent intervention or inpatient treatment. Pt is hemodynamically stable and mentating appropriately. Discussed findings and plan with patient/guardian, who agrees with care plan. All questions answered. Return precautions discussed and outpatient follow up given.   New Prescriptions   CYCLOBENZAPRINE (FLEXERIL) 10 MG TABLET    Take 1 tablet (10 mg total) by mouth 2 (two) times daily as needed for  muscle spasms.   HYDROCORTISONE (ANUSOL-HC) 25 MG SUPPOSITORY    Place 1 suppository (25 mg total) rectally 2 (two) times daily. For 7 days   IBUPROFEN (ADVIL,MOTRIN) 800 MG TABLET    Take 1 tablet (800 mg total) by mouth 3 (three) times daily.        Wynetta Emery, PA-C 09/08/14 1453  Doug Sou, MD 09/08/14 1504

## 2014-09-08 NOTE — ED Notes (Signed)
Pt c/o right arm heaviness x 5month and " bleeding hemorrhoids"

## 2014-09-08 NOTE — ED Notes (Signed)
Pt declined Occult Blood rectal exam by PA-C

## 2016-06-09 ENCOUNTER — Emergency Department (HOSPITAL_BASED_OUTPATIENT_CLINIC_OR_DEPARTMENT_OTHER)
Admission: EM | Admit: 2016-06-09 | Discharge: 2016-06-09 | Disposition: A | Discharge status: Home / Self Care | Payer: Medicare Other | Attending: Emergency Medicine | Admitting: Emergency Medicine

## 2016-06-09 ENCOUNTER — Encounter (HOSPITAL_BASED_OUTPATIENT_CLINIC_OR_DEPARTMENT_OTHER): Payer: Self-pay | Admitting: *Deleted

## 2016-06-09 DIAGNOSIS — Z79899 Other long term (current) drug therapy: Secondary | ICD-10-CM | POA: Diagnosis not present

## 2016-06-09 DIAGNOSIS — Y999 Unspecified external cause status: Secondary | ICD-10-CM | POA: Insufficient documentation

## 2016-06-09 DIAGNOSIS — J45909 Unspecified asthma, uncomplicated: Secondary | ICD-10-CM | POA: Diagnosis not present

## 2016-06-09 DIAGNOSIS — Y939 Activity, unspecified: Secondary | ICD-10-CM | POA: Diagnosis not present

## 2016-06-09 DIAGNOSIS — F17210 Nicotine dependence, cigarettes, uncomplicated: Secondary | ICD-10-CM | POA: Insufficient documentation

## 2016-06-09 DIAGNOSIS — I10 Essential (primary) hypertension: Secondary | ICD-10-CM | POA: Diagnosis not present

## 2016-06-09 DIAGNOSIS — T1490XA Injury, unspecified, initial encounter: Secondary | ICD-10-CM | POA: Insufficient documentation

## 2016-06-09 DIAGNOSIS — W57XXXA Bitten or stung by nonvenomous insect and other nonvenomous arthropods, initial encounter: Secondary | ICD-10-CM | POA: Diagnosis not present

## 2016-06-09 DIAGNOSIS — Y9289 Other specified places as the place of occurrence of the external cause: Secondary | ICD-10-CM | POA: Diagnosis not present

## 2016-06-09 MED ORDER — BUDESONIDE-FORMOTEROL FUMARATE 160-4.5 MCG/ACT IN AERO: 2.0000 | INHALATION_SPRAY | Freq: Two times a day (BID) | RESPIRATORY_TRACT | 0 refills | Status: AC

## 2016-06-09 MED ORDER — DIPHENHYDRAMINE HCL 25 MG PO TABS: 25.0000 mg | ORAL_TABLET | Freq: Four times a day (QID) | ORAL | 0 refills | Status: AC

## 2016-06-09 MED ORDER — HYDROCORTISONE 2.5 % EX LOTN: TOPICAL_LOTION | Freq: Two times a day (BID) | CUTANEOUS | 0 refills | Status: AC

## 2016-06-09 MED FILL — HYDROCORTISONE 2.5% CREAM: 2.5 | 30 days supply | Qty: 60 | Fill #0

## 2016-06-09 MED FILL — BANOPHEN 25 MG CAPSULE: 25 | 25 days supply | Qty: 100 | Fill #0

## 2016-06-09 NOTE — ED Triage Notes (Signed)
Possible bed bugs.

## 2016-06-09 NOTE — Discharge Instructions (Signed)
Medications: Benadryl, hydrocortisone cream  Treatment: Take Benadryl every 6 hours as needed for itching. Apply hydrocortisone cream twice daily to any bites.  Follow-up: Please see your primary care provider or return to department develop any new or worsening symptoms. If you develop any fevers or any of the bites become increasingly red, painful, begin to drain yellow drainage, or streaking from the area, please return to emergency department. Make sure to speak with the hotel about this problem. Once you are out of the situation, make sure to wash all of your clothes in hot water.

## 2016-06-09 NOTE — ED Provider Notes (Signed)
[facility] Provider Note   CSN: [id number] Arrival date & time: 06/09/16  1054     History   Chief Complaint Chief Complaint  Patient presents with  . Insect Bite    HPI Brittney Wagner is a 51 y.o. female with history of asthma, fibromyalgia, hypertension, obesity who presents with concern for bedbugs. Patient states she has been staying in a hotel and has seen bedbugs in the bed. She has had numerous insect bites over the past few weeks that have resolved. She does not have any bites today. Patient has used an over-the-counter itching cream without significant relief when she has the bites, however that resolved with time. Patient is currently trying to get into another living situation and plans to talk to the hotel today about the bedbugs. Patient denies any fevers.  HPI  Past Medical History:  Diagnosis Date  . Asthma   . Fibromyalgia   . Hypertension   . Obesity     There are no active problems to display for this patient.   Past Surgical History:  Procedure Laterality Date  . ABDOMINAL HYSTERECTOMY      OB History    No data available       Home Medications    Prior to Admission medications   Medication Sig Start Date End Date Taking? Authorizing Provider  albuterol (PROVENTIL HFA;VENTOLIN HFA) 108 (90 BASE) MCG/ACT inhaler Inhale 2 puffs into the lungs every 6 (six) hours as needed for wheezing.   Yes Historical Provider, MD  ALPRAZolam (XANAX PO) Take by mouth.   Yes Historical Provider, MD  doxepin (SINEQUAN) 10 MG capsule Take 10 mg by mouth.   Yes Historical Provider, MD  hydrochlorothiazide (HYDRODIURIL) 25 MG tablet Take 25 mg by mouth daily.   Yes Historical Provider, MD  oxyCODONE-acetaminophen (PERCOCET/ROXICET) 5-325 MG per tablet Take 1 tablet by mouth every 6 (six) hours as needed for severe pain. 08/02/14  Yes Marlon Pel, PA-C  albuterol (PROVENTIL) (5 MG/ML) 0.5% nebulizer solution Take 2.5 mg by nebulization every 6 (six) hours  as needed for wheezing.    Historical Provider, MD  budesonide-formoterol (SYMBICORT) 160-4.5 MCG/ACT inhaler Inhale 2 puffs into the lungs 2 (two) times daily. 06/09/16   Emi Holes, PA-C  cyclobenzaprine (FLEXERIL) 10 MG tablet Take 1 tablet (10 mg total) by mouth 2 (two) times daily as needed for muscle spasms. 09/08/14   Nicole Pisciotta, PA-C  diazepam (VALIUM) 5 MG tablet Take 1 tablet (5 mg total) by mouth every 4 (four) hours as needed (muscle strain). 06/04/12   Lorre Nick, MD  diphenhydrAMINE (BENADRYL) 25 MG tablet Take 1 tablet (25 mg total) by mouth every 6 (six) hours. 06/09/16   Emi Holes, PA-C  hydrocortisone (ANUSOL-HC) 25 MG suppository Place 1 suppository (25 mg total) rectally 2 (two) times daily. For 7 days 09/08/14   Joni Reining Pisciotta, PA-C  hydrocortisone 2.5 % lotion Apply topically 2 (two) times daily. 06/09/16   Emi Holes, PA-C  ibuprofen (ADVIL,MOTRIN) 800 MG tablet Take 1 tablet (800 mg total) by mouth 3 (three) times daily. 09/08/14   Nicole Pisciotta, PA-C  lisinopril (PRINIVIL,ZESTRIL) 5 MG tablet Take 5 mg by mouth daily.    Historical Provider, MD  naproxen (NAPROSYN) 500 MG tablet Take 1 tablet (500 mg total) by mouth 2 (two) times daily as needed. 01/04/14   Blane Ohara, MD  oxyCODONE-acetaminophen (PERCOCET/ROXICET) 5-325 MG per tablet Take 2 tablets by mouth every 4 (four) hours as needed for  pain. 06/04/12   Lorre Nick, MD    Family History No family history on file.  Social History Social History  Substance Use Topics  . Smoking status: Current Some Day Smoker    Types: Cigarettes  . Smokeless tobacco: Never Used  . Alcohol use No     Allergies   Shellfish allergy and Eggs or egg-derived products   Review of Systems Review of Systems  Constitutional: Negative for fever.  Skin: Negative for color change, rash and wound.     Physical Exam Updated Vital Signs BP 175/83   Pulse 84   Temp 98.5 F (36.9 C) (Oral)   Resp 20   Ht  5' 6.5" (1.689 m)   Wt 103.9 kg   SpO2 99%   BMI 36.41 kg/m   Physical Exam  Constitutional: She appears well-developed and well-nourished. No distress.  HENT:  Head: Normocephalic and atraumatic.  Mouth/Throat: Oropharynx is clear and moist. No oropharyngeal exudate.  Eyes: Conjunctivae are normal. Pupils are equal, round, and reactive to light. Right eye exhibits no discharge. Left eye exhibits no discharge. No scleral icterus.  Neck: Normal range of motion. Neck supple. No thyromegaly present.  Cardiovascular: Normal rate, regular rhythm, normal heart sounds and intact distal pulses.  Exam reveals no gallop and no friction rub.   No murmur heard. Pulmonary/Chest: Effort normal and breath sounds normal. No stridor. No respiratory distress. She has no wheezes. She has no rales.  Abdominal: Soft. Bowel sounds are normal. She exhibits no distension. There is no tenderness. There is no rebound and no guarding.  Musculoskeletal: She exhibits no edema.  Lymphadenopathy:    She has no cervical adenopathy.  Neurological: She is alert. Coordination normal.  Skin: Skin is warm and dry. No rash noted. She is not diaphoretic. No pallor.  No areas of redness or bites at this time  Psychiatric: Her mood appears anxious.  Nursing note and vitals reviewed.    ED Treatments / Results  Labs (all labs ordered are listed, but only abnormal results are displayed) Labs Reviewed - No data to display  EKG  EKG Interpretation None       Radiology No results found.  Procedures Procedures (including critical care time)  Medications Ordered in ED Medications - No data to display   Initial Impression / Assessment and Plan / ED Course  I have reviewed the triage vital signs and the nursing notes.  Pertinent labs & imaging results that were available during my care of the patient were reviewed by me and considered in my medical decision making (see chart for details).     Patient  concerned about bed bug bites. Patient is very anxious and upset about the problem, however is stable. Patient showed me pictures of the bites and vitals were found in the bed and I agree this may be a problem. I advised patient to speak with the hotel about this problem and also continue working on trying to find another living situation. I advised patient to wash all closed in hot water after she is out of this living situation. I have discharged patient home with Benadryl and hydrocortisone lotion in case of more bites. Return precautions discussed. Patient understands and agrees with plan. Patient discharged in satisfactory condition.  Final Clinical Impressions(s) / ED Diagnoses   Final diagnoses:  Insect bite, initial encounter    New Prescriptions Discharge Medication List as of 06/09/2016 11:28 AM    START taking these medications   Details  diphenhydrAMINE (BENADRYL) 25 MG tablet Take 1 tablet (25 mg total) by mouth every 6 (six) hours., Starting Tue 06/09/2016, Print    hydrocortisone 2.5 % lotion Apply topically 2 (two) times daily., Starting Tue 06/09/2016, Print            Emi Holeslexandra M Tanaka Gillen, PA-C 06/09/16 1640    Doug SouSam Jacubowitz, MD 06/09/16 [phone]

## 2017-09-21 ENCOUNTER — Emergency Department (HOSPITAL_BASED_OUTPATIENT_CLINIC_OR_DEPARTMENT_OTHER): Payer: Medicare Other

## 2017-09-21 ENCOUNTER — Encounter (HOSPITAL_BASED_OUTPATIENT_CLINIC_OR_DEPARTMENT_OTHER): Payer: Self-pay

## 2017-09-21 ENCOUNTER — Emergency Department (HOSPITAL_BASED_OUTPATIENT_CLINIC_OR_DEPARTMENT_OTHER)
Admission: EM | Admit: 2017-09-21 | Discharge: 2017-09-21 | Disposition: A | Discharge status: Home / Self Care | Payer: Medicare Other | Attending: Physician Assistant | Admitting: Physician Assistant

## 2017-09-21 ENCOUNTER — Other Ambulatory Visit: Payer: Self-pay

## 2017-09-21 DIAGNOSIS — R404 Transient alteration of awareness: Secondary | ICD-10-CM | POA: Diagnosis not present

## 2017-09-21 DIAGNOSIS — R0789 Other chest pain: Secondary | ICD-10-CM | POA: Diagnosis present

## 2017-09-21 DIAGNOSIS — Z79899 Other long term (current) drug therapy: Secondary | ICD-10-CM | POA: Insufficient documentation

## 2017-09-21 DIAGNOSIS — I10 Essential (primary) hypertension: Secondary | ICD-10-CM | POA: Diagnosis not present

## 2017-09-21 DIAGNOSIS — J45909 Unspecified asthma, uncomplicated: Secondary | ICD-10-CM | POA: Insufficient documentation

## 2017-09-21 DIAGNOSIS — F17210 Nicotine dependence, cigarettes, uncomplicated: Secondary | ICD-10-CM | POA: Diagnosis not present

## 2017-09-21 NOTE — ED Notes (Signed)
Pt refused blood draw d/t [facility], states she is almost out of her pain meds, wants a upper body rib brace and a cane. Pt notified we don't provide those braces here.
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
## 2017-09-22 ENCOUNTER — Emergency Department (HOSPITAL_BASED_OUTPATIENT_CLINIC_OR_DEPARTMENT_OTHER)
Admission: EM | Admit: 2017-09-22 | Discharge: 2017-09-22 | Disposition: A | Discharge status: Home / Self Care | Payer: Medicare Other | Attending: Emergency Medicine | Admitting: Emergency Medicine

## 2017-09-22 ENCOUNTER — Encounter (HOSPITAL_BASED_OUTPATIENT_CLINIC_OR_DEPARTMENT_OTHER): Payer: Self-pay

## 2017-09-22 ENCOUNTER — Other Ambulatory Visit: Payer: Self-pay

## 2017-09-22 DIAGNOSIS — J45909 Unspecified asthma, uncomplicated: Secondary | ICD-10-CM | POA: Diagnosis not present

## 2017-09-22 DIAGNOSIS — R0602 Shortness of breath: Secondary | ICD-10-CM | POA: Insufficient documentation

## 2017-09-22 DIAGNOSIS — Z79899 Other long term (current) drug therapy: Secondary | ICD-10-CM | POA: Insufficient documentation

## 2017-09-22 DIAGNOSIS — F17210 Nicotine dependence, cigarettes, uncomplicated: Secondary | ICD-10-CM | POA: Insufficient documentation

## 2017-09-22 DIAGNOSIS — I10 Essential (primary) hypertension: Secondary | ICD-10-CM | POA: Diagnosis not present

## 2017-09-22 MED ORDER — FUROSEMIDE 20 MG PO TABS: 20.0000 mg | ORAL_TABLET | Freq: Every day | ORAL | 0 refills | Status: AC

## 2017-09-22 MED FILL — FUROSEMIDE 20 MG TAB: 20 | 7 days supply | Qty: 7 | Fill #0

## 2017-09-22 NOTE — ED Notes (Signed)
Pt verbalized understanding of dc instructions.
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
## 2017-09-22 NOTE — Discharge Instructions (Signed)
[facility] to discuss shortness of breath. They are also the best ones to follow-up with about any changes to your pain medications or muscle relaxers.  Lasix has been prescribed to assist with removing fluid from your lungs.

## 2017-09-22 NOTE — ED Triage Notes (Signed)
Pt c/o cont'd "hurts to breathe" and a "knot" to left breast-seen here for same c/o yesterday-NAD-steady gait

## 2017-10-05 ENCOUNTER — Other Ambulatory Visit: Payer: Self-pay

## 2017-10-05 ENCOUNTER — Emergency Department (HOSPITAL_BASED_OUTPATIENT_CLINIC_OR_DEPARTMENT_OTHER): Payer: Medicare Other

## 2017-10-05 ENCOUNTER — Encounter (HOSPITAL_BASED_OUTPATIENT_CLINIC_OR_DEPARTMENT_OTHER): Payer: Self-pay

## 2017-10-05 ENCOUNTER — Emergency Department (HOSPITAL_BASED_OUTPATIENT_CLINIC_OR_DEPARTMENT_OTHER)
Admission: EM | Admit: 2017-10-05 | Discharge: 2017-10-05 | Disposition: A | Discharge status: Home / Self Care | Payer: Medicare Other | Attending: Emergency Medicine | Admitting: Emergency Medicine

## 2017-10-05 DIAGNOSIS — S299XXD Unspecified injury of thorax, subsequent encounter: Secondary | ICD-10-CM | POA: Diagnosis present

## 2017-10-05 DIAGNOSIS — I10 Essential (primary) hypertension: Secondary | ICD-10-CM | POA: Insufficient documentation

## 2017-10-05 DIAGNOSIS — S2242XD Multiple fractures of ribs, left side, subsequent encounter for fracture with routine healing: Secondary | ICD-10-CM | POA: Insufficient documentation

## 2017-10-05 DIAGNOSIS — Z79899 Other long term (current) drug therapy: Secondary | ICD-10-CM | POA: Diagnosis not present

## 2017-10-05 DIAGNOSIS — J45909 Unspecified asthma, uncomplicated: Secondary | ICD-10-CM | POA: Insufficient documentation

## 2017-10-05 DIAGNOSIS — F17210 Nicotine dependence, cigarettes, uncomplicated: Secondary | ICD-10-CM | POA: Diagnosis not present

## 2017-10-05 MED ORDER — KETOROLAC TROMETHAMINE 60 MG/2ML IM SOLN
60.0000 mg | Freq: Once | INTRAMUSCULAR | Status: AC
  Administered 2017-10-05: 60 mg via INTRAMUSCULAR
  Filled 2017-10-05: qty 2

## 2017-10-05 MED ORDER — METHOCARBAMOL 500 MG PO TABS
1000.0000 mg | ORAL_TABLET | Freq: Once | ORAL | Status: AC
  Administered 2017-10-05: 1000 mg via ORAL
  Filled 2017-10-05: qty 2

## 2017-10-05 NOTE — ED Notes (Signed)
Upon entering pt's exam area and telling her what the EDP ordered for pain, pt replied, "that's what he calls something for pain?"  Informed pt as to what the medications are and what they are for, pt is visibly upset with medication choice.
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
## 2017-10-05 NOTE — ED Notes (Signed)
Pt verbalizes understanding of d/c instructions and denies any further needs at this time. 
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
## 2019-09-08 ENCOUNTER — Other Ambulatory Visit: Payer: Self-pay | Admitting: Orthopaedic Surgery

## 2019-09-08 DIAGNOSIS — M4316 Spondylolisthesis, lumbar region: Secondary | ICD-10-CM

## 2020-04-03 ENCOUNTER — Encounter: Payer: Self-pay | Admitting: Neurology

## 2020-04-03 ENCOUNTER — Ambulatory Visit: Payer: Medicare Other | Admitting: Neurology

## 2022-03-17 ENCOUNTER — Other Ambulatory Visit: Payer: Self-pay

## 2022-03-17 ENCOUNTER — Encounter (HOSPITAL_BASED_OUTPATIENT_CLINIC_OR_DEPARTMENT_OTHER): Payer: Self-pay

## 2022-03-17 ENCOUNTER — Emergency Department (HOSPITAL_BASED_OUTPATIENT_CLINIC_OR_DEPARTMENT_OTHER)
Admission: EM | Admit: 2022-03-17 | Discharge: 2022-03-17 | Discharge status: Against Medical Advice | Payer: Medicare Other | Attending: Emergency Medicine | Admitting: Emergency Medicine

## 2022-03-17 DIAGNOSIS — R112 Nausea with vomiting, unspecified: Secondary | ICD-10-CM | POA: Insufficient documentation

## 2022-03-17 DIAGNOSIS — R109 Unspecified abdominal pain: Secondary | ICD-10-CM | POA: Insufficient documentation

## 2022-03-17 DIAGNOSIS — Z5321 Procedure and treatment not carried out due to patient leaving prior to being seen by health care provider: Secondary | ICD-10-CM | POA: Insufficient documentation

## 2022-03-17 HISTORY — DX: Chronic obstructive pulmonary disease, unspecified: J44.9

## 2022-03-17 NOTE — ED Notes (Signed)
Called for second time  No response from lobby 

## 2022-03-17 NOTE — ED Triage Notes (Signed)
Pt complaining of nausea, vomited, stomach burning started 2-3 days ago and is worse now.

## 2022-03-17 NOTE — ED Notes (Signed)
Called pt to take to treatment area, no response from lobby

## 2023-08-20 ENCOUNTER — Other Ambulatory Visit: Payer: Self-pay

## 2023-08-20 ENCOUNTER — Emergency Department (HOSPITAL_BASED_OUTPATIENT_CLINIC_OR_DEPARTMENT_OTHER)

## 2023-08-20 ENCOUNTER — Emergency Department (HOSPITAL_BASED_OUTPATIENT_CLINIC_OR_DEPARTMENT_OTHER)
Admission: EM | Admit: 2023-08-20 | Discharge: 2023-08-21 | Disposition: A | Discharge status: Home / Self Care | Attending: Emergency Medicine | Admitting: Emergency Medicine

## 2023-08-20 DIAGNOSIS — J441 Chronic obstructive pulmonary disease with (acute) exacerbation: Secondary | ICD-10-CM | POA: Diagnosis not present

## 2023-08-20 DIAGNOSIS — R600 Localized edema: Secondary | ICD-10-CM | POA: Diagnosis not present

## 2023-08-20 DIAGNOSIS — Z7951 Long term (current) use of inhaled steroids: Secondary | ICD-10-CM | POA: Insufficient documentation

## 2023-08-20 DIAGNOSIS — R0602 Shortness of breath: Secondary | ICD-10-CM | POA: Diagnosis present

## 2023-08-20 LAB — BASIC METABOLIC PANEL WITH GFR
Anion gap: 10 (ref 5–15)
BUN: 15 mg/dL (ref 6–20)
CO2: 28 mmol/L (ref 22–32)
Calcium: 9.1 mg/dL (ref 8.9–10.3)
Chloride: 101 mmol/L (ref 98–111)
Creatinine, Ser: 1 mg/dL (ref 0.44–1.00)
GFR, Estimated: >60 mL/min (ref >60)
Glucose, Bld: 86 mg/dL (ref 70–99)
Potassium: 3.7 mmol/L (ref 3.5–5.1)
Sodium: 139 mmol/L (ref 135–145)

## 2023-08-20 LAB — CBC
HCT: 38.2 % (ref 36.0–46.0)
Hemoglobin: 12.2 g/dL (ref 12.0–15.0)
MCH: 25.6 pg — ABNORMAL LOW (ref 26.0–34.0)
MCHC: 31.9 g/dL (ref 30.0–36.0)
MCV: 80.3 fL (ref 80.0–100.0)
Platelets: 202 10*3/uL (ref 150–400)
RBC: 4.76 MIL/uL (ref 3.87–5.11)
RDW: 14.9 % (ref 11.5–15.5)
WBC: 6.1 10*3/uL (ref 4.0–10.5)
nRBC: 0 % (ref 0.0–0.2)

## 2023-08-20 LAB — TROPONIN T, HIGH SENSITIVITY: Troponin T High Sensitivity: <15 ng/L (ref <19)

## 2023-08-20 MED ORDER — ALBUTEROL SULFATE (2.5 MG/3ML) 0.083% IN NEBU
5.0000 mg | INHALATION_SOLUTION | Freq: Once | RESPIRATORY_TRACT | Status: AC
  Administered 2023-08-21: 5 mg via RESPIRATORY_TRACT
  Filled 2023-08-20: qty 6

## 2023-08-20 MED ORDER — OXYCODONE HCL 5 MG PO TABS
20.0000 mg | ORAL_TABLET | Freq: Once | ORAL | Status: AC
  Administered 2023-08-20: 20 mg via ORAL
  Filled 2023-08-20: qty 4

## 2023-08-20 MED ORDER — FUROSEMIDE 10 MG/ML IJ SOLN
20.0000 mg | Freq: Once | INTRAMUSCULAR | Status: AC
  Administered 2023-08-20: 20 mg via INTRAVENOUS
  Filled 2023-08-20: qty 2

## 2023-08-20 NOTE — ED Triage Notes (Signed)
 Pt reports concern for SOB, chest pain, and bilateral leg swelling that has been ongoing for 2 weeks. Reports she stopped taking her lasix  for the past month. Reports intermittent"pinching" chest pain  Hx CHF and presents with heart monitor.

## 2023-08-20 NOTE — ED Provider Notes (Signed)
 [place] EMERGENCY DEPARTMENT AT [facility] Provider Note   CSN: [id number] Arrival date & time: 08/20/23  2129     History  Chief Complaint  Patient presents with   Shortness of Breath   Chest Pain    Brittney Wagner is a 58 y.o. female.  The history is provided by the patient.  Shortness of Breath Associated symptoms: chest pain   Chest Pain Associated symptoms: shortness of breath   Brittney Wagner is a 58 y.o. female who presents to the Emergency Department complaining of difficulty breathing.  She presents to the emergency department for evaluation of 1 week of shortness of breath that is constant.  She also complains of bilateral pinching chest pain.  She has chronic back pain, unchanged from baseline.  No fever.  She did have abdominal pain and diarrhea but these are now gone.  She did have nausea, also resolved.  She uses Symbicort  but this is not changing her symptoms.  She also complains of bilateral lower extremity edema for 1 week.    Hx/o back pain, leaking heart valve, copd. Not currently on lasix .  Heart monitor for one week.       Home Medications Prior to Admission medications   Medication Sig Start Date End Date Taking? Authorizing Provider  albuterol  (PROVENTIL  HFA;VENTOLIN  HFA) 108 (90 BASE) MCG/ACT inhaler Inhale 2 puffs into the lungs every 6 (six) hours as needed for wheezing.    [provider]  albuterol  (PROVENTIL ) (5 MG/ML) 0.5% nebulizer solution Take 2.5 mg by nebulization every 6 (six) hours as needed for wheezing.    [provider]  ALPRAZolam (XANAX PO) Take by mouth.    [provider]  budesonide -formoterol  (SYMBICORT ) 160-4.5 MCG/ACT inhaler Inhale 2 puffs into the lungs 2 (two) times daily. 06/09/16   Law, Alexandra M, PA-C  cyclobenzaprine  (FLEXERIL ) 10 MG tablet Take 1 tablet (10 mg total) by mouth 2 (two) times daily as needed for muscle spasms. 09/08/14   Pisciotta, Peterson Brandt, PA-C  diazepam  (VALIUM )  5 MG tablet Take 1 tablet (5 mg total) by mouth every 4 (four) hours as needed (muscle strain). 06/04/12   Lind Repine, MD  diphenhydrAMINE  (BENADRYL ) 25 MG tablet Take 1 tablet (25 mg total) by mouth every 6 (six) hours. 06/09/16   Law, Alexandra M, PA-C  doxepin (SINEQUAN) 10 MG capsule Take 10 mg by mouth.    [provider]  furosemide  (LASIX ) 20 MG tablet Take 1 tablet (20 mg total) by mouth daily for 7 days. 09/22/17 09/29/17  Mortis, Connell Degree I, PA-C  hydrochlorothiazide (HYDRODIURIL) 25 MG tablet Take 25 mg by mouth daily.    [provider]  hydrocortisone  (ANUSOL -HC) 25 MG suppository Place 1 suppository (25 mg total) rectally 2 (two) times daily. For 7 days 09/08/14   Pisciotta, Peterson Brandt, PA-C  hydrocortisone  2.5 % lotion Apply topically 2 (two) times daily. 06/09/16   Law, Alexandra M, PA-C  ibuprofen  (ADVIL ,MOTRIN ) 800 MG tablet Take 1 tablet (800 mg total) by mouth 3 (three) times daily. 09/08/14   Pisciotta, Peterson Brandt, PA-C  lisinopril (PRINIVIL,ZESTRIL) 5 MG tablet Take 5 mg by mouth daily.    [provider]  naproxen  (NAPROSYN ) 500 MG tablet Take 1 tablet (500 mg total) by mouth 2 (two) times daily as needed. 01/04/14   Clay Cummins, MD  oxyCODONE -acetaminophen  (PERCOCET/ROXICET) 5-325 MG per tablet Take 2 tablets by mouth every 4 (four) hours as needed for pain. 06/04/12   Lind Repine, MD  oxyCODONE -acetaminophen  (PERCOCET/ROXICET) [phone]  MG per tablet Take 1 tablet by mouth every 6 (six) hours as needed for severe pain. 08/02/14   Amil Balding, PA-C      Allergies    Shellfish allergy and Egg-derived products    Review of Systems   Review of Systems  Respiratory:  Positive for shortness of breath.   Cardiovascular:  Positive for chest pain.  All other systems reviewed and are negative.   Physical Exam Updated Vital Signs BP (!) 173/82 (BP Location: Right Arm)   Pulse (!) 52   Temp 98.2 F (36.8 C) (Oral)   Resp 18   SpO2 100%  Physical  Exam Vitals and nursing note reviewed.  Constitutional:      Appearance: She is well-developed.  HENT:     Head: Normocephalic and atraumatic.  Cardiovascular:     Rate and Rhythm: Regular rhythm. Bradycardia present.     Heart sounds: No murmur heard. Pulmonary:     Effort: Pulmonary effort is normal. No respiratory distress.     Comments: Expiratory wheezes bilaterally.  Good air movement Abdominal:     Palpations: Abdomen is soft.     Tenderness: There is no abdominal tenderness. There is no guarding or rebound.  Musculoskeletal:     Comments: 2+ DP pulses bilaterally. Nonpitting edema to BLE  Skin:    General: Skin is warm and dry.  Neurological:     Mental Status: She is alert and oriented to person, place, and time.  Psychiatric:        Behavior: Behavior normal.     ED Results / Procedures / Treatments   Labs (all labs ordered are listed, but only abnormal results are displayed) Labs Reviewed  CBC - Abnormal; Notable for the following components:      Result Value   MCH 25.6 (*)    All other components within normal limits  BASIC METABOLIC PANEL WITH GFR  PRO BRAIN NATRIURETIC PEPTIDE  D-DIMER, QUANTITATIVE  URINALYSIS, ROUTINE W REFLEX MICROSCOPIC  TROPONIN T, HIGH SENSITIVITY  TROPONIN T, HIGH SENSITIVITY    EKG EKG Interpretation Date/Time:  Friday Aug 20 2023 21:43:53 EDT Ventricular Rate:  52 PR Interval:  222 QRS Duration:  174 QT Interval:  461 QTC Calculation: 429 R Axis:   -45  Text Interpretation: Sinus rhythm Prolonged PR interval Consider left atrial enlargement Right bundle branch block LVH with IVCD and secondary repol abnrm No previous ECGs available Confirmed by Kelsey Patricia [phone]) on 08/20/2023 10:59:50 PM  Radiology DG Chest 2 View Result Date: 08/20/2023 CLINICAL DATA:  Chest pain and shortness of breath EXAM: CHEST - 2 VIEW COMPARISON:  09/21/2017 09/04/2022 FINDINGS: Stable cardiomediastinal silhouette. No focal consolidation,  pleural effusion, or pneumothorax. No displaced rib fractures. IMPRESSION: No active cardiopulmonary disease. Electronically Signed   By: Rozell Cornet M.D.   On: 08/20/2023 22:44    Procedures Procedures    Medications Ordered in ED Medications  albuterol  (PROVENTIL ) (2.5 MG/3ML) 0.083% nebulizer solution 5 mg (5 mg Nebulization Given 08/21/23 0036)  furosemide  (LASIX ) injection 20 mg (20 mg Intravenous Given 08/20/23 2342)  oxyCODONE  (Oxy IR/ROXICODONE ) immediate release tablet 20 mg (20 mg Oral Given 08/20/23 2339)  dexamethasone (DECADRON) tablet 10 mg (10 mg Oral Given 08/21/23 0109)    ED Course/ Medical Decision Making/ A&P                                 Medical Decision Making Amount  and/or Complexity of Data Reviewed Labs: ordered. Radiology: ordered.  Risk Prescription drug management.   Patient with history of COPD, chronic back pain here for evaluation of shortness of breath, lower extremity edema.  She does have expiratory wheezes on initial evaluation but no respiratory distress.  She has slight edema to lower extremities.  Chest x-ray is negative for acute abnormality.  EKG is abnormal, no priors are available in our system but in care everywhere it does appear that this is likely baseline.  Troponins are negative x 2, BNP is within normal limits.  D-dimer is negative and she is otherwise low risk for DVT/PE.  Current clinical picture is not consistent with ACS, decompensated CHF, PE, pneumonia.  On repeat assessment after albuterol  lungs are clear bilaterally with no wheezes.  She does report feeling improved.  Suspect she may have a COPD exacerbation that is driving her shortness of breath.  Will treat with one-time dose of Decadron for her symptoms.  Discussed that she will need to follow-up with her PCP for further evaluation and recheck.  Return precautions discussed.        Final Clinical Impression(s) / ED Diagnoses Final diagnoses:  COPD exacerbation (HCC)   Bilateral lower extremity edema    Rx / DC Orders ED Discharge Orders     None         Kelsey Patricia, MD 08/21/23 0400

## 2023-08-21 DIAGNOSIS — J441 Chronic obstructive pulmonary disease with (acute) exacerbation: Secondary | ICD-10-CM | POA: Diagnosis not present

## 2023-08-21 LAB — D-DIMER, QUANTITATIVE: D-Dimer, Quant: 0.3 ug{FEU}/mL (ref 0.00–0.50)

## 2023-08-21 LAB — PRO BRAIN NATRIURETIC PEPTIDE: Pro Brain Natriuretic Peptide: 216 pg/mL (ref <300.0)

## 2023-08-21 LAB — TROPONIN T, HIGH SENSITIVITY: Troponin T High Sensitivity: <15 ng/L (ref <19)

## 2023-08-21 MED ORDER — DEXAMETHASONE 4 MG PO TABS
10.0000 mg | ORAL_TABLET | Freq: Once | ORAL | Status: AC
  Administered 2023-08-21: 10 mg via ORAL
  Filled 2023-08-21: qty 3

## 2023-09-01 ENCOUNTER — Ambulatory Visit: Admitting: Pain Medicine

## 2023-09-15 ENCOUNTER — Ambulatory Visit: Payer: Self-pay | Admitting: Pain Medicine

## 2023-09-16 ENCOUNTER — Other Ambulatory Visit (HOSPITAL_COMMUNITY): Payer: Self-pay | Admitting: Internal Medicine

## 2023-09-16 DIAGNOSIS — Z8679 Personal history of other diseases of the circulatory system: Secondary | ICD-10-CM

## 2023-10-18 ENCOUNTER — Telehealth (HOSPITAL_COMMUNITY): Payer: Self-pay | Admitting: Emergency Medicine

## 2023-10-18 NOTE — Telephone Encounter (Signed)
 Vm box full Camie Shutter RN Navigator Cardiac Imaging [facility] [phone] Office  [phone] Cell

## 2023-10-19 ENCOUNTER — Ambulatory Visit (HOSPITAL_BASED_OUTPATIENT_CLINIC_OR_DEPARTMENT_OTHER): Admission: RE | Admit: 2023-10-19 | Source: Ambulatory Visit

## 2023-10-25 ENCOUNTER — Telehealth (HOSPITAL_COMMUNITY): Payer: Self-pay | Admitting: *Deleted

## 2023-10-25 NOTE — Telephone Encounter (Signed)
Attempted to call patient regarding upcoming cardiac CT appointment. Unable to leave VM, VM full. Johney Frame RN Navigator Cardiac Imaging [facility] [phone] Office

## 2023-10-26 ENCOUNTER — Ambulatory Visit (HOSPITAL_BASED_OUTPATIENT_CLINIC_OR_DEPARTMENT_OTHER): Admission: RE | Admit: 2023-10-26 | Source: Ambulatory Visit

## 2023-11-01 ENCOUNTER — Telehealth (HOSPITAL_COMMUNITY): Payer: Self-pay | Admitting: Emergency Medicine

## 2023-11-01 ENCOUNTER — Telehealth (HOSPITAL_COMMUNITY): Payer: Self-pay | Admitting: *Deleted

## 2023-11-01 NOTE — Telephone Encounter (Signed)
Patient returning call about her upcoming cardiac imaging study; pt verbalizes understanding of appt date/time, parking situation and where to check in, pre-test NPO status, and verified current allergies; name and call back number provided for further questions should they arise  Adreyan Carbajal RN Navigator Cardiac Imaging [place] [phone] office [phone] cell  

## 2023-11-01 NOTE — Telephone Encounter (Signed)
 Unable to leave vm Rockwell Alexandria RN Navigator Cardiac Imaging [facility] [phone] Office  [phone] Cell

## 2023-11-02 ENCOUNTER — Encounter (HOSPITAL_BASED_OUTPATIENT_CLINIC_OR_DEPARTMENT_OTHER): Payer: Self-pay

## 2023-11-02 ENCOUNTER — Ambulatory Visit (HOSPITAL_BASED_OUTPATIENT_CLINIC_OR_DEPARTMENT_OTHER)
Admission: RE | Admit: 2023-11-02 | Discharge: 2023-11-02 | Disposition: A | Discharge status: Home / Self Care | Source: Ambulatory Visit | Attending: Internal Medicine | Admitting: Internal Medicine

## 2023-11-02 DIAGNOSIS — R079 Chest pain, unspecified: Secondary | ICD-10-CM | POA: Insufficient documentation

## 2023-11-02 DIAGNOSIS — I509 Heart failure, unspecified: Secondary | ICD-10-CM | POA: Insufficient documentation

## 2023-11-02 DIAGNOSIS — Z8679 Personal history of other diseases of the circulatory system: Secondary | ICD-10-CM | POA: Insufficient documentation

## 2023-11-02 MED ORDER — IOHEXOL 350 MG/ML SOLN
100.0000 mL | Freq: Once | INTRAVENOUS | Status: AC | PRN
  Administered 2023-11-02: 95 mL via INTRAVENOUS

## 2023-11-02 MED ORDER — NITROGLYCERIN 0.4 MG SL SUBL: 0.8000 mg | SUBLINGUAL_TABLET | Freq: Once | SUBLINGUAL | Status: DC
